# Patient Record
Sex: Female | Born: 1985 | Race: Black or African American | Hispanic: No | Marital: Single | Smoking: Current every day smoker
Health system: Southern US, Community
[De-identification: ages and names within clinical notes are randomized; demographics above are authoritative.]

## PROBLEM LIST (undated history)

## (undated) DIAGNOSIS — F419 Anxiety disorder, unspecified: Secondary | ICD-10-CM

## (undated) DIAGNOSIS — D573 Sickle-cell trait: Secondary | ICD-10-CM

## (undated) DIAGNOSIS — S0990XA Unspecified injury of head, initial encounter: Secondary | ICD-10-CM

## (undated) DIAGNOSIS — J45909 Unspecified asthma, uncomplicated: Secondary | ICD-10-CM

## (undated) HISTORY — PX: NO PAST SURGERIES: SHX2092

---

## 2016-03-11 ENCOUNTER — Encounter (HOSPITAL_COMMUNITY): Payer: Self-pay

## 2016-03-11 DIAGNOSIS — S61411A Laceration without foreign body of right hand, initial encounter: Secondary | ICD-10-CM | POA: Insufficient documentation

## 2016-03-11 DIAGNOSIS — W268XXA Contact with other sharp object(s), not elsewhere classified, initial encounter: Secondary | ICD-10-CM | POA: Insufficient documentation

## 2016-03-11 DIAGNOSIS — Z91040 Latex allergy status: Secondary | ICD-10-CM | POA: Insufficient documentation

## 2016-03-11 DIAGNOSIS — Y999 Unspecified external cause status: Secondary | ICD-10-CM | POA: Insufficient documentation

## 2016-03-11 DIAGNOSIS — Y939 Activity, unspecified: Secondary | ICD-10-CM | POA: Insufficient documentation

## 2016-03-11 DIAGNOSIS — Y929 Unspecified place or not applicable: Secondary | ICD-10-CM | POA: Insufficient documentation

## 2016-03-11 DIAGNOSIS — J45909 Unspecified asthma, uncomplicated: Secondary | ICD-10-CM | POA: Insufficient documentation

## 2016-03-11 NOTE — ED Triage Notes (Signed)
Pt states she was cut in right hand by broken broom; pt presents with lack in palm of right hand; bleeding controled at triage; pt c/o pain at 10/10 on arrival.

## 2016-03-12 ENCOUNTER — Emergency Department (HOSPITAL_COMMUNITY): Payer: Self-pay

## 2016-03-12 ENCOUNTER — Emergency Department (HOSPITAL_COMMUNITY)
Admission: EM | Admit: 2016-03-12 | Discharge: 2016-03-12 | Disposition: A | Discharge status: Home / Self Care | Payer: Self-pay | Attending: Emergency Medicine | Admitting: Emergency Medicine

## 2016-03-12 DIAGNOSIS — S61411A Laceration without foreign body of right hand, initial encounter: Secondary | ICD-10-CM

## 2016-03-12 HISTORY — DX: Unspecified asthma, uncomplicated: J45.909

## 2016-03-12 MED ORDER — OXYCODONE-ACETAMINOPHEN 5-325 MG PO TABS
1.0000 | ORAL_TABLET | Freq: Once | ORAL | Status: AC
  Administered 2016-03-12: 1 via ORAL
  Filled 2016-03-12: qty 1

## 2016-03-12 MED ORDER — HYDROCODONE-ACETAMINOPHEN 5-325 MG PO TABS: 1.0000 | ORAL_TABLET | Freq: Once | ORAL | Status: DC

## 2016-03-12 MED ORDER — LIDOCAINE HCL (PF) 1 % IJ SOLN
5.0000 mL | Freq: Once | INTRAMUSCULAR | Status: AC
  Administered 2016-03-12: 5 mL
  Filled 2016-03-12: qty 5

## 2016-03-12 NOTE — ED Notes (Signed)
Applied gauze dressing to pt hand.  Delay explained to pt

## 2016-03-12 NOTE — ED Notes (Signed)
Patient transported to x-ray. ?

## 2016-03-12 NOTE — ED Provider Notes (Signed)
[facility]-EMERGENCY DEPT Provider Note   CSN: [id number] Arrival date & time: 03/11/16  2218     History   Chief Complaint Chief Complaint  Patient presents with  . Extremity Laceration    HPI Jacqueline [name] is a 31 y.o. female.  Patient presents with laceration to right palm caused by catching the hand against a broken metal broom handle earlier this evening. No other injury. She reports her last tetanus was 2 years ago.   The history is provided by the patient. A language interpreter was used.    Past Medical History:  Diagnosis Date  . Asthma     There are no active problems to display for this patient.   History reviewed. No pertinent surgical history.  OB History    No data available       Home Medications    Prior to Admission medications   Not on File    Family History No family history on file.  Social History Social History  Substance Use Topics  . Smoking status: Not on file  . Smokeless tobacco: Not on file  . Alcohol use Not on file     Allergies   Latex and Norco [hydrocodone-acetaminophen]   Review of Systems Review of Systems  Constitutional: Negative for diaphoresis.  Gastrointestinal: Negative for nausea.  Musculoskeletal:       See HPI.  Skin: Positive for wound.  Neurological: Negative for numbness.     Physical Exam Updated Vital Signs BP 117/81   Pulse 79   Temp 97.8 F (36.6 C) (Oral)   Resp 18   LMP 02/08/2016 (Approximate)   SpO2 96%   Physical Exam  Constitutional: She is oriented to person, place, and time. She appears well-developed and well-nourished.  Neck: Normal range of motion.  Pulmonary/Chest: Effort normal.  Musculoskeletal:  FROM all digits.  Neurological: She is alert and oriented to person, place, and time.  Skin: Skin is warm and dry.  2 cm laceration to palmar right hand. No tendon exposure.      ED Treatments / Results  Labs (all labs ordered are listed, but only abnormal results are  displayed) Labs Reviewed - No data to display  EKG  EKG Interpretation None       Radiology Dg Hand Complete Right  Result Date: 03/12/2016 CLINICAL DATA:  31 year old female with laceration of the right palm. EXAM: RIGHT HAND - COMPLETE 3+ VIEW COMPARISON:  None. FINDINGS: There is no acute fracture or dislocation. The bones are well mineralized. No arthritic changes. Skin laceration of the palm. No radiopaque foreign object. IMPRESSION: No acute osseous pathology.  No radiopaque foreign object. Electronically Signed   By: Elgie Collard M.D.   On: 03/12/2016 02:48    Procedures Procedures (including critical care time) LACERATION REPAIR Performed by: Elpidio Anis A Authorized by: Elpidio Anis A Consent: Verbal consent obtained. Risks and benefits: risks, benefits and alternatives were discussed Consent given by: patient Patient identity confirmed: provided demographic data Prepped and Draped in normal sterile fashion Wound explored  Laceration Location: right palmar hand  Laceration Length: 2 cm  No Foreign Bodies seen or palpated  Anesthesia: local infiltration  Local anesthetic: lidocaine 2% w/o epinephrine  Anesthetic total: 2 ml  Irrigation method: syringe Amount of cleaning: standard  Skin closure: 4-0 prolene  Number of sutures: 4  Technique: simple interrupted  Patient tolerance: Patient tolerated the procedure well with no immediate complications.  Medications Ordered in ED Medications  lidocaine (PF) (XYLOCAINE) 1 %  injection 5 mL (5 mLs Infiltration Given 03/12/16 0350)  oxyCODONE-acetaminophen (PERCOCET/ROXICET) 5-325 MG per tablet 1 tablet (1 tablet Oral Given 03/12/16 0341)     Initial Impression / Assessment and Plan / ED Course  I have reviewed the triage vital signs and the nursing notes.  Pertinent labs & imaging results that were available during my care of the patient were reviewed by me and considered in my medical decision making  (see chart for details).     Laceration to hand caused by metal broom handle. No loss of function. No FB visualized on direct inspection or imaging. Repaired as per above note.  Final Clinical Impressions(s) / ED Diagnoses   Final diagnoses:  None   1. Right hand laceration  New Prescriptions New Prescriptions   No medications on file     Elpidio AnisShari Anniebelle Devore, PA-C 03/12/16 0440    Tomasita CrumbleAdeleke Oni, MD 03/12/16 [phone]

## 2017-05-19 ENCOUNTER — Other Ambulatory Visit: Payer: Self-pay

## 2017-05-19 ENCOUNTER — Emergency Department (HOSPITAL_BASED_OUTPATIENT_CLINIC_OR_DEPARTMENT_OTHER)
Admission: EM | Admit: 2017-05-19 | Discharge: 2017-05-19 | Disposition: A | Discharge status: Home / Self Care | Payer: Self-pay | Attending: Emergency Medicine | Admitting: Emergency Medicine

## 2017-05-19 ENCOUNTER — Encounter (HOSPITAL_BASED_OUTPATIENT_CLINIC_OR_DEPARTMENT_OTHER): Payer: Self-pay

## 2017-05-19 ENCOUNTER — Emergency Department (HOSPITAL_BASED_OUTPATIENT_CLINIC_OR_DEPARTMENT_OTHER): Payer: Self-pay

## 2017-05-19 DIAGNOSIS — R110 Nausea: Secondary | ICD-10-CM

## 2017-05-19 DIAGNOSIS — J45909 Unspecified asthma, uncomplicated: Secondary | ICD-10-CM | POA: Insufficient documentation

## 2017-05-19 DIAGNOSIS — A599 Trichomoniasis, unspecified: Secondary | ICD-10-CM | POA: Insufficient documentation

## 2017-05-19 DIAGNOSIS — R51 Headache: Secondary | ICD-10-CM | POA: Insufficient documentation

## 2017-05-19 DIAGNOSIS — A64 Unspecified sexually transmitted disease: Secondary | ICD-10-CM

## 2017-05-19 LAB — URINALYSIS, MICROSCOPIC (REFLEX): RBC / HPF: NONE SEEN RBC/hpf (ref 0–5)

## 2017-05-19 LAB — URINALYSIS, ROUTINE W REFLEX MICROSCOPIC
BILIRUBIN URINE: NEGATIVE
GLUCOSE, UA: NEGATIVE mg/dL
HGB URINE DIPSTICK: NEGATIVE
Ketones, ur: NEGATIVE mg/dL
Nitrite: NEGATIVE
PH: 6 (ref 5.0–8.0)
Protein, ur: 30 mg/dL — AB

## 2017-05-19 LAB — PREGNANCY, URINE: Preg Test, Ur: NEGATIVE

## 2017-05-19 MED ORDER — METRONIDAZOLE 500 MG PO TABS
2000.0000 mg | ORAL_TABLET | Freq: Once | ORAL | Status: AC
  Administered 2017-05-19: 2000 mg via ORAL
  Filled 2017-05-19: qty 4

## 2017-05-19 MED ORDER — ONDANSETRON 8 MG PO TBDP
8.0000 mg | ORAL_TABLET | Freq: Once | ORAL | Status: AC
  Administered 2017-05-19: 8 mg via ORAL
  Filled 2017-05-19: qty 1

## 2017-05-19 MED ORDER — CEFTRIAXONE SODIUM 250 MG IJ SOLR
250.0000 mg | Freq: Once | INTRAMUSCULAR | Status: AC
  Administered 2017-05-19: 250 mg via INTRAMUSCULAR
  Filled 2017-05-19: qty 250

## 2017-05-19 MED ORDER — AZITHROMYCIN 1 G PO PACK
1.0000 g | PACK | Freq: Once | ORAL | Status: AC
  Administered 2017-05-19: 1 g via ORAL
  Filled 2017-05-19: qty 1

## 2017-05-19 NOTE — ED Provider Notes (Signed)
[facility] EMERGENCY DEPARTMENT Provider Note   CSN: [id number] Arrival date & time: 05/19/17  0302     History   Chief Complaint Chief Complaint  Patient presents with  . Nausea    HPI Jacqueline [name] is a 32 y.o. female.  The history is provided by the patient.  Emesis   The current episode started more than 1 week ago. The problem occurs 5 to 10 times per day (dry heaves when she first wakes up and sits up and dry heaves.  ). Emesis appearance: dry heaves. There has been no fever. Pertinent negatives include no abdominal pain, no arthralgias, no chills, no cough, no diarrhea, no fever, no myalgias, no sweats and no URI. Risk factors: none.  Denies f/c/r. Denies bleeding or discharge.  Denies constipation or diarrhea.  Denies changes in vision or speech, no weakness or numbness.   Past Medical History:  Diagnosis Date  . Asthma     There are no active problems to display for this patient.   History reviewed. No pertinent surgical history.   OB History   None      Home Medications    Prior to Admission medications   Not on File    Family History No family history on file.  Social History Social History   Tobacco Use  . Smoking status: Not on file  Substance Use Topics  . Alcohol use: Not on file  . Drug use: Not on file     Allergies   Latex and Norco [hydrocodone-acetaminophen]   Review of Systems Review of Systems  Constitutional: Negative for appetite change, chills and fever.  Respiratory: Negative for cough.   Cardiovascular: Negative for chest pain.  Gastrointestinal: Positive for vomiting. Negative for abdominal pain and diarrhea.  Genitourinary: Negative for dysuria, flank pain, genital sores, menstrual problem, pelvic pain, vaginal bleeding and vaginal discharge.  Musculoskeletal: Negative for arthralgias and myalgias.  Neurological: Negative for seizures, syncope, speech difficulty, weakness and numbness.  All other systems  reviewed and are negative.    Physical Exam Updated Vital Signs BP (!) 151/97 (BP Location: Right Arm)   Pulse 87   Temp 98 F (36.7 C) (Oral)   Resp 18   Ht  (1.702 m)   Wt 75.8 kg (167 lb)   LMP 04/20/2017 Comment: not a full period on 4/1, last normal period was in 02/2017  SpO2 100%   BMI 26.16 kg/m   Physical Exam  Constitutional: She is oriented to person, place, and time. She appears well-developed and well-nourished. No distress.  HENT:  Head: Normocephalic and atraumatic.  Nose: Nose normal.  Mouth/Throat: No oropharyngeal exudate.  Eyes: Pupils are equal, round, and reactive to light. Conjunctivae and EOM are normal.  Neck: Normal range of motion. Neck supple.  Cardiovascular: Normal rate, regular rhythm, normal heart sounds and intact distal pulses.  Pulmonary/Chest: Effort normal and breath sounds normal. No stridor. She has no wheezes. She has no rales.  Abdominal: Soft. Bowel sounds are normal. She exhibits no mass. There is no tenderness. There is no rebound and no guarding.  Musculoskeletal: Normal range of motion.  Neurological: She is alert and oriented to person, place, and time. She displays normal reflexes.  Skin: Skin is warm and dry. Capillary refill takes less than 2 seconds.  Psychiatric: She has a normal mood and affect.     ED Treatments / Results  Labs (all labs ordered are listed, but only abnormal results are displayed) Results for  orders placed or performed during the hospital encounter of 05/19/17  Pregnancy, urine  Result Value Ref Range   Preg Test, Ur NEGATIVE NEGATIVE  Urinalysis, Routine w reflex microscopic  Result Value Ref Range   Color, Urine YELLOW YELLOW   APPearance HAZY (A) CLEAR   Specific Gravity, Urine >1.030 (H) 1.005 - 1.030   pH 6.0 5.0 - 8.0   Glucose, UA NEGATIVE NEGATIVE mg/dL   Hgb urine dipstick NEGATIVE NEGATIVE   Bilirubin Urine NEGATIVE NEGATIVE   Ketones, ur NEGATIVE NEGATIVE mg/dL   Protein, ur 30  (A) NEGATIVE mg/dL   Nitrite NEGATIVE NEGATIVE   Leukocytes, UA TRACE (A) NEGATIVE  Urinalysis, Microscopic (reflex)  Result Value Ref Range   RBC / HPF NONE SEEN 0 - 5 RBC/hpf   WBC, UA 11-20 0 - 5 WBC/hpf   Bacteria, UA RARE (A) NONE SEEN   Squamous Epithelial / LPF 6-10 0 - 5   Trichomonas, UA PRESENT    Ct Head Wo Contrast  Result Date: 05/19/2017 CLINICAL DATA:  Frontal headache with nausea EXAM: CT HEAD WITHOUT CONTRAST TECHNIQUE: Contiguous axial images were obtained from the base of the skull through the vertex without intravenous contrast. COMPARISON:  None. FINDINGS: Brain: No evidence of acute infarction, hemorrhage, hydrocephalus, extra-axial collection or mass lesion/mass effect. Vascular: No hyperdense vessel or unexpected calcification. Skull: Mild trigonocephaly.  No fracture. Sinuses/Orbits: No acute finding. Other: None IMPRESSION: Negative.  No CT evidence for acute intracranial abnormality. Electronically Signed   By: Jasmine Pang M.D.   On: 05/19/2017 04:01   Radiology Ct Head Wo Contrast  Result Date: 05/19/2017 CLINICAL DATA:  Frontal headache with nausea EXAM: CT HEAD WITHOUT CONTRAST TECHNIQUE: Contiguous axial images were obtained from the base of the skull through the vertex without intravenous contrast. COMPARISON:  None. FINDINGS: Brain: No evidence of acute infarction, hemorrhage, hydrocephalus, extra-axial collection or mass lesion/mass effect. Vascular: No hyperdense vessel or unexpected calcification. Skull: Mild trigonocephaly.  No fracture. Sinuses/Orbits: No acute finding. Other: None IMPRESSION: Negative.  No CT evidence for acute intracranial abnormality. Electronically Signed   By: Jasmine Pang M.D.   On: 05/19/2017 04:01    Procedures Procedures (including critical care time)  Medications Ordered in ED Medications  cefTRIAXone (ROCEPHIN) injection 250 mg (has no administration in time range)  azithromycin (ZITHROMAX) powder 1 g (has no  administration in time range)  metroNIDAZOLE (FLAGYL) tablet 2,000 mg (has no administration in time range)  ondansetron (ZOFRAN-ODT) disintegrating tablet 8 mg (8 mg Oral Given 05/19/17 0332)     Final Clinical Impressions(s) / ED Diagnoses   No signs of space occupying lesion on CT.  Symptoms are undoubtedly due to trichomonas.  Will also treat for GC and chlamydia.  No sexual activity of any kind until 7 days after all partners treated for GC, chlamydia and trichomonas.  Patient verbalizes understanding.    Return for weakness, numbness, changes in vision or speech, fevers >100.4 unrelieved by medication, shortness of breath, intractable vomiting, or diarrhea, abdominal pain, Inability to tolerate liquids or food, cough, altered mental status or any concerns. No signs of systemic illness or infection. The patient is nontoxic-appearing on exam and vital signs are within normal limits.   I have reviewed the triage vital signs and the nursing notes. Pertinent labs &imaging results that were available during my care of the patient were reviewed by me and considered in my medical decision making (see chart for details).  After history, exam, and medical workup I  feel the patient has been appropriately medically screened and is safe for discharge home. Pertinent diagnoses were discussed with the patient. Patient was given return precautions.   Monaye Blackie, MD 05/19/17 [phone]

## 2017-05-19 NOTE — ED Triage Notes (Signed)
Pt c/o nausea and dry heaving in the morning for the last week, no abdominal pain, no diarrhea, no fevers

## 2017-07-16 ENCOUNTER — Encounter (HOSPITAL_COMMUNITY): Payer: Self-pay | Admitting: *Deleted

## 2017-07-16 ENCOUNTER — Emergency Department (HOSPITAL_BASED_OUTPATIENT_CLINIC_OR_DEPARTMENT_OTHER): Payer: Self-pay

## 2017-07-16 ENCOUNTER — Other Ambulatory Visit: Payer: Self-pay

## 2017-07-16 ENCOUNTER — Other Ambulatory Visit: Payer: Self-pay | Admitting: Orthopedic Surgery

## 2017-07-16 ENCOUNTER — Encounter (HOSPITAL_BASED_OUTPATIENT_CLINIC_OR_DEPARTMENT_OTHER): Payer: Self-pay | Admitting: Emergency Medicine

## 2017-07-16 ENCOUNTER — Emergency Department (HOSPITAL_BASED_OUTPATIENT_CLINIC_OR_DEPARTMENT_OTHER)
Admission: EM | Admit: 2017-07-16 | Discharge: 2017-07-16 | Disposition: A | Discharge status: Home / Self Care | Payer: Self-pay | Attending: Emergency Medicine | Admitting: Emergency Medicine

## 2017-07-16 DIAGNOSIS — S62303A Unspecified fracture of third metacarpal bone, left hand, initial encounter for closed fracture: Secondary | ICD-10-CM | POA: Insufficient documentation

## 2017-07-16 DIAGNOSIS — W228XXA Striking against or struck by other objects, initial encounter: Secondary | ICD-10-CM | POA: Insufficient documentation

## 2017-07-16 DIAGNOSIS — Y939 Activity, unspecified: Secondary | ICD-10-CM | POA: Insufficient documentation

## 2017-07-16 DIAGNOSIS — J45909 Unspecified asthma, uncomplicated: Secondary | ICD-10-CM | POA: Insufficient documentation

## 2017-07-16 DIAGNOSIS — Y929 Unspecified place or not applicable: Secondary | ICD-10-CM | POA: Insufficient documentation

## 2017-07-16 DIAGNOSIS — Y998 Other external cause status: Secondary | ICD-10-CM | POA: Insufficient documentation

## 2017-07-16 MED ORDER — OXYCODONE-ACETAMINOPHEN 5-325 MG PO TABS: 1.0000 | ORAL_TABLET | Freq: Three times a day (TID) | ORAL | 0 refills | Status: AC | PRN

## 2017-07-16 MED ORDER — ACETAMINOPHEN ER 650 MG PO TBCR: 650.0000 mg | EXTENDED_RELEASE_TABLET | Freq: Three times a day (TID) | ORAL | 0 refills | Status: AC

## 2017-07-16 MED ORDER — OXYCODONE-ACETAMINOPHEN 5-325 MG PO TABS
1.0000 | ORAL_TABLET | Freq: Once | ORAL | Status: AC
  Administered 2017-07-16: 1 via ORAL
  Filled 2017-07-16: qty 1

## 2017-07-16 NOTE — Discharge Instructions (Addendum)
Take Tylenol around-the-clock for pain, and Percocet only if the pain is severe. See the hand surgeon as requested.

## 2017-07-16 NOTE — ED Triage Notes (Signed)
Pt c/o 10/10 left hand pain after she hit it with a door last night. Hand looks swollen on triage.

## 2017-07-16 NOTE — ED Provider Notes (Signed)
[facility] EMERGENCY DEPARTMENT Provider Note   CSN: [id number] Arrival date & time: 07/16/17  0321     History   Chief Complaint Chief Complaint  Patient presents with  . Hand Injury    HPI Jacqueline [name] is a 32 y.o. female.  HPI  32 year old female comes in with chief complaint of hand pain. Yesterday patient had punched a piece of furniture and instantly had severe pain.  Over time her pain and swelling is gone up, therefore she decided to come to the ER.  Patient has numbness over the dorsal part of her hand, but denies any numbness over her fingers.  Pain is primarily at the dorsum of the hand, where there is significant swelling as well.  Patient is right-handed.  Past Medical History:  Diagnosis Date  . Asthma     There are no active problems to display for this patient.   History reviewed. No pertinent surgical history.   OB History   None      Home Medications    Prior to Admission medications   Medication Sig Start Date End Date Taking? Authorizing Provider  acetaminophen (TYLENOL 8 HOUR) 650 MG CR tablet Take 1 tablet (650 mg total) by mouth every 8 (eight) hours. 07/16/17   Derwood Kaplan, MD  oxyCODONE-acetaminophen (PERCOCET/ROXICET) 5-325 MG tablet Take 1 tablet by mouth every 8 (eight) hours as needed for severe pain. 07/16/17   Derwood Kaplan, MD    Family History History reviewed. No pertinent family history.  Social History Social History   Tobacco Use  . Smoking status: Never Smoker  . Smokeless tobacco: Never Used  Substance Use Topics  . Alcohol use: Not Currently  . Drug use: Never     Allergies   Latex and Norco [hydrocodone-acetaminophen]   Review of Systems Review of Systems  Constitutional: Positive for activity change.  Musculoskeletal: Positive for arthralgias and myalgias.  Skin: Negative for wound.  Neurological: Negative for numbness.     Physical Exam Updated Vital Signs BP (!) 133/93 (BP  Location: Right Arm)   Pulse 88   Temp 98.3 F (36.8 C) (Oral)   Resp 18   Ht 5\' 7"  (1.702 m)   Wt 75.8 kg (167 lb)   LMP 07/16/2017   SpO2 100%   BMI 26.16 kg/m   Physical Exam  Constitutional: She is oriented to person, place, and time. She appears well-developed.  HENT:  Head: Normocephalic and atraumatic.  Eyes: EOM are normal.  Neck: Normal range of motion. Neck supple.  Cardiovascular: Normal rate.  Pulmonary/Chest: Effort normal.  Abdominal: Bowel sounds are normal.  Musculoskeletal:  Patient has significant edema over the dorsum of her left hand.  She has tenderness to palpation in that region and subjective numbness. No tenderness over the wrist and sensory exam over the digits is normal.  Neurological: She is alert and oriented to person, place, and time.  Skin: Skin is warm and dry.  Nursing note and vitals reviewed.    ED Treatments / Results  Labs (all labs ordered are listed, but only abnormal results are displayed) Labs Reviewed - No data to display  EKG None  Radiology Dg Hand Complete Left  Result Date: 07/16/2017 CLINICAL DATA:  32 y/o F; punched a cabinet door. Pain of the second, third, and fourth metacarpals. EXAM: LEFT HAND - COMPLETE 3+ VIEW COMPARISON:  None. FINDINGS: Acute oblique mildly displaced fracture of the third metacarpal diaphysis. No additional fracture or joint dislocation. IMPRESSION: Acute oblique  mildly displaced fracture of the third metacarpal diaphysis. Electronically Signed   By: Mitzi HansenLance  Furusawa-Stratton M.D.   On: 07/16/2017 03:55    Procedures Procedures (including critical care time)  Medications Ordered in ED Medications  oxyCODONE-acetaminophen (PERCOCET/ROXICET) 5-325 MG per tablet 1 tablet (has no administration in time range)     Initial Impression / Assessment and Plan / ED Course  I have reviewed the triage vital signs and the nursing notes.  Pertinent labs & imaging results that were available during my  care of the patient were reviewed by me and considered in my medical decision making (see chart for details).     Patient comes in with chief complaint of hand swelling and pain.  She injured herself when she punched a piece of furniture, and as a result it seems like she has a metacarpal fracture that is displaced.  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  We will advised her to follow-up with hand surgery.   Given the significant edema over the dorsum of the hand we will put her in a volar splint rather than a radial gutter.  Final Clinical Impressions(s) / ED Diagnoses   Final diagnoses:  Closed displaced fracture of third metacarpal bone of left hand, unspecified portion of metacarpal, initial encounter    ED Discharge Orders        Ordered    oxyCODONE-acetaminophen (PERCOCET/ROXICET) 5-325 MG tablet  Every 8 hours PRN     07/16/17 0421    acetaminophen (TYLENOL 8 HOUR) 650 MG CR tablet  Every 8 hours     07/16/17 0421       Derwood KaplanNanavati, Kamariyah Timberlake, MD 07/16/17 [phone]

## 2017-07-17 ENCOUNTER — Encounter (HOSPITAL_COMMUNITY)
Admission: RE | Disposition: A | Discharge status: Home / Self Care | Payer: Self-pay | Source: Ambulatory Visit | Attending: Orthopedic Surgery

## 2017-07-17 ENCOUNTER — Encounter (HOSPITAL_COMMUNITY): Payer: Self-pay | Admitting: *Deleted

## 2017-07-17 ENCOUNTER — Ambulatory Visit (HOSPITAL_COMMUNITY): Payer: Self-pay | Admitting: Certified Registered Nurse Anesthetist

## 2017-07-17 ENCOUNTER — Ambulatory Visit (HOSPITAL_COMMUNITY)
Admission: RE | Admit: 2017-07-17 | Discharge: 2017-07-17 | Disposition: A | Discharge status: Home / Self Care | Payer: Self-pay | Source: Ambulatory Visit | Attending: Orthopedic Surgery | Admitting: Orthopedic Surgery

## 2017-07-17 DIAGNOSIS — Z91040 Latex allergy status: Secondary | ICD-10-CM | POA: Insufficient documentation

## 2017-07-17 DIAGNOSIS — Z885 Allergy status to narcotic agent status: Secondary | ICD-10-CM | POA: Insufficient documentation

## 2017-07-17 DIAGNOSIS — J45909 Unspecified asthma, uncomplicated: Secondary | ICD-10-CM | POA: Insufficient documentation

## 2017-07-17 DIAGNOSIS — X58XXXA Exposure to other specified factors, initial encounter: Secondary | ICD-10-CM | POA: Insufficient documentation

## 2017-07-17 DIAGNOSIS — S62303A Unspecified fracture of third metacarpal bone, left hand, initial encounter for closed fracture: Secondary | ICD-10-CM | POA: Insufficient documentation

## 2017-07-17 DIAGNOSIS — D573 Sickle-cell trait: Secondary | ICD-10-CM | POA: Insufficient documentation

## 2017-07-17 HISTORY — PX: OPEN REDUCTION INTERNAL FIXATION (ORIF) METACARPAL: SHX6234

## 2017-07-17 HISTORY — DX: Anxiety disorder, unspecified: F41.9

## 2017-07-17 HISTORY — DX: Unspecified injury of head, initial encounter: S09.90XA

## 2017-07-17 HISTORY — DX: Sickle-cell trait: D57.3

## 2017-07-17 LAB — CBC
HCT: 38.1 % (ref 36.0–46.0)
Hemoglobin: 12.2 g/dL (ref 12.0–15.0)
MCH: 23.7 pg — ABNORMAL LOW (ref 26.0–34.0)
MCHC: 32 g/dL (ref 30.0–36.0)
MCV: 74.1 fL — AB (ref 78.0–100.0)
PLATELETS: 214 10*3/uL (ref 150–400)
RBC: 5.14 MIL/uL — AB (ref 3.87–5.11)
RDW: 14.7 % (ref 11.5–15.5)
WBC: 7.2 10*3/uL (ref 4.0–10.5)

## 2017-07-17 LAB — POCT PREGNANCY, URINE: Preg Test, Ur: NEGATIVE

## 2017-07-17 SURGERY — OPEN REDUCTION INTERNAL FIXATION (ORIF) METACARPAL
Anesthesia: General | Site: Hand | Laterality: Left

## 2017-07-17 MED ORDER — 0.9 % SODIUM CHLORIDE (POUR BTL) OPTIME
TOPICAL | Status: DC | PRN
  Administered 2017-07-17: 1000 mL

## 2017-07-17 MED ORDER — OXYCODONE HCL 5 MG PO TABS
ORAL_TABLET | ORAL | Status: AC
  Filled 2017-07-17: qty 1

## 2017-07-17 MED ORDER — BUPIVACAINE HCL (PF) 0.25 % IJ SOLN
INTRAMUSCULAR | Status: AC
  Filled 2017-07-17: qty 30

## 2017-07-17 MED ORDER — OXYCODONE HCL 5 MG/5ML PO SOLN: 5.0000 mg | Freq: Once | ORAL | Status: AC | PRN

## 2017-07-17 MED ORDER — ONDANSETRON HCL 4 MG/2ML IJ SOLN
INTRAMUSCULAR | Status: AC
  Filled 2017-07-17: qty 2

## 2017-07-17 MED ORDER — DEXAMETHASONE SODIUM PHOSPHATE 10 MG/ML IJ SOLN
INTRAMUSCULAR | Status: AC
  Filled 2017-07-17: qty 1

## 2017-07-17 MED ORDER — PHENYLEPHRINE 40 MCG/ML (10ML) SYRINGE FOR IV PUSH (FOR BLOOD PRESSURE SUPPORT)
PREFILLED_SYRINGE | INTRAVENOUS | Status: AC
  Filled 2017-07-17: qty 10

## 2017-07-17 MED ORDER — SUGAMMADEX SODIUM 200 MG/2ML IV SOLN
INTRAVENOUS | Status: AC
  Filled 2017-07-17: qty 2

## 2017-07-17 MED ORDER — FENTANYL CITRATE (PF) 250 MCG/5ML IJ SOLN
INTRAMUSCULAR | Status: AC
  Filled 2017-07-17: qty 5

## 2017-07-17 MED ORDER — MEPERIDINE HCL 50 MG/ML IJ SOLN: 6.2500 mg | INTRAMUSCULAR | Status: DC | PRN

## 2017-07-17 MED ORDER — LIDOCAINE HCL (CARDIAC) PF 100 MG/5ML IV SOSY
PREFILLED_SYRINGE | INTRAVENOUS | Status: DC | PRN
  Administered 2017-07-17: 50 mg via INTRAVENOUS

## 2017-07-17 MED ORDER — LACTATED RINGERS IV SOLN
INTRAVENOUS | Status: DC
  Administered 2017-07-17: 11:00:00 via INTRAVENOUS

## 2017-07-17 MED ORDER — HYDROMORPHONE HCL 1 MG/ML IJ SOLN
0.2500 mg | INTRAMUSCULAR | Status: DC | PRN
  Administered 2017-07-17 (×3): 0.5 mg via INTRAVENOUS

## 2017-07-17 MED ORDER — BUPIVACAINE HCL (PF) 0.25 % IJ SOLN
INTRAMUSCULAR | Status: DC | PRN
  Administered 2017-07-17: 10 mL

## 2017-07-17 MED ORDER — ONDANSETRON HCL 4 MG/2ML IJ SOLN
INTRAMUSCULAR | Status: DC | PRN
  Administered 2017-07-17: 4 mg via INTRAVENOUS

## 2017-07-17 MED ORDER — HYDROMORPHONE HCL 1 MG/ML IJ SOLN
INTRAMUSCULAR | Status: AC
  Administered 2017-07-17: 0.5 mg via INTRAVENOUS
  Filled 2017-07-17: qty 1

## 2017-07-17 MED ORDER — CHLORHEXIDINE GLUCONATE 4 % EX LIQD: 60.0000 mL | Freq: Once | CUTANEOUS | Status: DC

## 2017-07-17 MED ORDER — PROMETHAZINE HCL 25 MG/ML IJ SOLN: 6.2500 mg | INTRAMUSCULAR | Status: DC | PRN

## 2017-07-17 MED ORDER — PROPOFOL 10 MG/ML IV BOLUS
INTRAVENOUS | Status: AC
  Filled 2017-07-17: qty 20

## 2017-07-17 MED ORDER — PROPOFOL 10 MG/ML IV BOLUS
INTRAVENOUS | Status: DC | PRN
  Administered 2017-07-17: 180 mg via INTRAVENOUS

## 2017-07-17 MED ORDER — DEXAMETHASONE SODIUM PHOSPHATE 10 MG/ML IJ SOLN
INTRAMUSCULAR | Status: DC | PRN
  Administered 2017-07-17: 10 mg via INTRAVENOUS

## 2017-07-17 MED ORDER — HYDROMORPHONE HCL 1 MG/ML IJ SOLN
INTRAMUSCULAR | Status: DC | PRN
  Administered 2017-07-17: 0.5 mg via INTRAVENOUS

## 2017-07-17 MED ORDER — CEFAZOLIN SODIUM-DEXTROSE 2-4 GM/100ML-% IV SOLN
2.0000 g | INTRAVENOUS | Status: AC
  Administered 2017-07-17: 2 g via INTRAVENOUS
  Filled 2017-07-17: qty 100

## 2017-07-17 MED ORDER — MIDAZOLAM HCL 2 MG/2ML IJ SOLN
INTRAMUSCULAR | Status: AC
  Filled 2017-07-17: qty 2

## 2017-07-17 MED ORDER — OXYCODONE HCL 5 MG PO TABS
5.0000 mg | ORAL_TABLET | Freq: Once | ORAL | Status: AC | PRN
  Administered 2017-07-17: 5 mg via ORAL

## 2017-07-17 MED ORDER — HYDROMORPHONE HCL 1 MG/ML IJ SOLN
INTRAMUSCULAR | Status: AC
  Filled 2017-07-17: qty 0.5

## 2017-07-17 MED ORDER — HYDROMORPHONE HCL 1 MG/ML IJ SOLN
INTRAMUSCULAR | Status: AC
  Filled 2017-07-17: qty 1

## 2017-07-17 MED ORDER — LACTATED RINGERS IV SOLN: INTRAVENOUS | Status: DC

## 2017-07-17 MED ORDER — FENTANYL CITRATE (PF) 100 MCG/2ML IJ SOLN
INTRAMUSCULAR | Status: DC | PRN
  Administered 2017-07-17 (×3): 50 ug via INTRAVENOUS
  Administered 2017-07-17: 100 ug via INTRAVENOUS

## 2017-07-17 MED ORDER — SUCCINYLCHOLINE CHLORIDE 200 MG/10ML IV SOSY
PREFILLED_SYRINGE | INTRAVENOUS | Status: AC
  Filled 2017-07-17: qty 10

## 2017-07-17 MED ORDER — MIDAZOLAM HCL 5 MG/5ML IJ SOLN
INTRAMUSCULAR | Status: DC | PRN
  Administered 2017-07-17: 2 mg via INTRAVENOUS

## 2017-07-17 SURGICAL SUPPLY — 41 items
BANDAGE ACE 3X5.8 VEL STRL LF (GAUZE/BANDAGES/DRESSINGS) ×3 IMPLANT
BANDAGE ACE 4X5 VEL STRL LF (GAUZE/BANDAGES/DRESSINGS) ×3 IMPLANT
BIT DRILL 1.1 MINI (BIT) ×1 IMPLANT
BNDG CMPR 9X4 STRL LF SNTH (GAUZE/BANDAGES/DRESSINGS) ×1
BNDG ESMARK 4X9 LF (GAUZE/BANDAGES/DRESSINGS) ×3 IMPLANT
BNDG GAUZE ELAST 4 BULKY (GAUZE/BANDAGES/DRESSINGS) ×3 IMPLANT
CLOSURE WOUND 1/2 X4 (GAUZE/BANDAGES/DRESSINGS) ×1
COVER SURGICAL LIGHT HANDLE (MISCELLANEOUS) ×3 IMPLANT
CUFF TOURNIQUET SINGLE 18IN (TOURNIQUET CUFF) ×3 IMPLANT
DRAPE C-ARM MINI 42X72 WSTRAPS (DRAPES) ×3 IMPLANT
DRAPE SURG 17X23 STRL (DRAPES) ×3 IMPLANT
DRILL BIT 1.1 MINI (BIT) ×3
DURAPREP 26ML APPLICATOR (WOUND CARE) ×3 IMPLANT
GAUZE SPONGE 4X4 12PLY STRL (GAUZE/BANDAGES/DRESSINGS) ×3 IMPLANT
GAUZE XEROFORM 1X8 LF (GAUZE/BANDAGES/DRESSINGS) ×3 IMPLANT
GLOVE SURG SYN 8.0 (GLOVE) ×3 IMPLANT
GOWN STRL REUS W/ TWL LRG LVL3 (GOWN DISPOSABLE) ×1 IMPLANT
GOWN STRL REUS W/ TWL XL LVL3 (GOWN DISPOSABLE) ×1 IMPLANT
GOWN STRL REUS W/TWL LRG LVL3 (GOWN DISPOSABLE) ×3
GOWN STRL REUS W/TWL XL LVL3 (GOWN DISPOSABLE) ×3
KIT BASIN OR (CUSTOM PROCEDURE TRAY) ×3 IMPLANT
KIT TURNOVER KIT B (KITS) ×3 IMPLANT
MANIFOLD NEPTUNE II (INSTRUMENTS) ×3 IMPLANT
NEEDLE 22X1 1/2 (OR ONLY) (NEEDLE) ×3 IMPLANT
NS IRRIG 1000ML POUR BTL (IV SOLUTION) ×3 IMPLANT
PACK ORTHO EXTREMITY (CUSTOM PROCEDURE TRAY) ×3 IMPLANT
PAD ARMBOARD 7.5X6 YLW CONV (MISCELLANEOUS) ×3 IMPLANT
PAD CAST 4YDX4 CTTN HI CHSV (CAST SUPPLIES) ×1 IMPLANT
PADDING CAST COTTON 4X4 STRL (CAST SUPPLIES) ×3
SCREW 1.5X10MM (Screw) ×6 IMPLANT
SCREW 1.5X9MM (Screw) ×3 IMPLANT
SCREW BN 9X1.5X3XST CRFRM (Screw) ×1 IMPLANT
SPLINT PLASTER CAST XFAST 4X15 (CAST SUPPLIES) ×1 IMPLANT
SPLINT PLASTER XTRA FAST SET 4 (CAST SUPPLIES) ×2
SPONGE LAP 4X18 RFD (DISPOSABLE) IMPLANT
STRIP CLOSURE SKIN 1/2X4 (GAUZE/BANDAGES/DRESSINGS) ×2 IMPLANT
SUT PROLENE 3 0 PS 2 (SUTURE) ×3 IMPLANT
SYR CONTROL 10ML LL (SYRINGE) ×3 IMPLANT
TOWEL OR 17X24 6PK STRL BLUE (TOWEL DISPOSABLE) ×3 IMPLANT
TOWEL OR 17X26 10 PK STRL BLUE (TOWEL DISPOSABLE) ×3 IMPLANT
UNDERPAD 30X30 (UNDERPADS AND DIAPERS) ×3 IMPLANT

## 2017-07-17 NOTE — Op Note (Signed)
Please see the dictated report [phone]

## 2017-07-17 NOTE — Anesthesia Procedure Notes (Signed)
Procedure Name: LMA Insertion Date/Time: 07/17/2017 1:30 PM Performed by: Adonis Housekeeperongell, Oliana Gowens M, CRNA Pre-anesthesia Checklist: Patient identified, Emergency Drugs available, Patient being monitored and Suction available Patient Re-evaluated:Patient Re-evaluated prior to induction Oxygen Delivery Method: Circle system utilized Preoxygenation: Pre-oxygenation with 100% oxygen Induction Type: IV induction Ventilation: Mask ventilation without difficulty LMA: LMA with gastric port inserted LMA Size: 4.0 Number of attempts: 1 Placement Confirmation: positive ETCO2 and breath sounds checked- equal and bilateral Tube secured with: Tape Dental Injury: Teeth and Oropharynx as per pre-operative assessment

## 2017-07-17 NOTE — H&P (Signed)
Jacqueline [name] is an 32 y.o. female.   Chief Complaint: left hand pain and deformity [id number]:patient's a very pleasant 32 year old right-hand-dominant female status post left hand trauma with displaced and shortened long metacarpal fracture nondominant left hand.  Past Medical History:  Diagnosis Date  . Anxiety   . Asthma    as a child  . Head injury due to trauma    "cut"  . Sickle cell trait [facility](HCC)     Past Surgical History:  Procedure Laterality Date  . NO PAST SURGERIES      History reviewed. No pertinent family history. Social History:  reports that she has been smoking.  She has smoked for the past 9.00 years. She has never used smokeless tobacco. She reports that she drank alcohol. She reports that she does not use drugs.  Allergies:  Allergies  Allergen Reactions  . Latex Itching  . Hydrocodone Hives and Rash    Medications Prior to Admission  Medication Sig Dispense Refill  . acetaminophen (TYLENOL 8 HOUR) 650 MG CR tablet Take 1 tablet (650 mg total) by mouth every 8 (eight) hours. 30 tablet 0  . oxyCODONE-acetaminophen (PERCOCET/ROXICET) 5-325 MG tablet Take 1 tablet by mouth every 8 (eight) hours as needed for severe pain. 12 tablet 0    Results for orders placed or performed during the hospital encounter of 07/17/17 (from the past 48 hour(s))  Pregnancy, urine POC     Status: None   Collection Time: 07/17/17 10:58 AM  Result Value Ref Range   Preg Test, Ur NEGATIVE NEGATIVE    Comment:        THE SENSITIVITY OF THIS METHODOLOGY IS >24 mIU/mL   CBC     Status: Abnormal   Collection Time: 07/17/17 11:09 AM  Result Value Ref Range   WBC 7.2 4.0 - 10.5 K/uL   RBC 5.14 (H) 3.87 - 5.11 MIL/uL   Hemoglobin 12.2 12.0 - 15.0 g/dL   HCT 25.938.1 56.336.0 - 87.546.0 %   MCV 74.1 (L) 78.0 - 100.0 fL   MCH 23.7 (L) 26.0 - 34.0 pg   MCHC 32.0 30.0 - 36.0 g/dL   RDW 64.314.7 32.911.5 - 51.815.5 %   Platelets 214 150 - 400 K/uL    Comment: Performed at [facility], [address] [place], [place] [address]   Dg Hand Complete Left  Result Date: 07/16/2017 CLINICAL DATA:  32 y/o F; punched a cabinet door. Pain of the second, third, and fourth metacarpals. EXAM: LEFT HAND - COMPLETE 3+ VIEW COMPARISON:  None. FINDINGS: Acute oblique mildly displaced fracture of the third metacarpal diaphysis. No additional fracture or joint dislocation. IMPRESSION: Acute oblique mildly displaced fracture of the third metacarpal diaphysis. Electronically Signed   By: Mitzi HansenLance  Furusawa-Stratton M.D.   On: 07/16/2017 03:55    Review of Systems  All other systems reviewed and are negative.   Blood pressure 130/61, pulse 64, temperature 98.7 F (37.1 C), temperature source Oral, resp. rate 18, last menstrual period 07/15/2017, SpO2 100 %. Physical Exam  Constitutional: She is oriented to person, place, and time. She appears well-developed and well-nourished.  HENT:  Head: Normocephalic and atraumatic.  Neck: Normal range of motion.  Cardiovascular: Normal rate.  Respiratory: Effort normal.  Musculoskeletal:       Left hand: She exhibits tenderness, bony tenderness and deformity.  Left hand pain, swelling, and rotational deformity to long finger status post trauma  Neurological: She is alert and oriented to person, place, and  time.  Skin: Skin is warm.  Psychiatric: She has a normal mood and affect. Her behavior is normal. Judgment and thought content normal.     Assessment/Plan 32 year old female with displaced left long finger metacarpal fracture. Have discussed in great detail the nature of her current predicament and treatment options. Patient understands the risks and benefits and wished to proceed with open reduction internal fixation left long finger metacarpal fracture as an outpatient.  Marlowe Shores, MD 07/17/2017, 1:04 PM

## 2017-07-17 NOTE — Anesthesia Preprocedure Evaluation (Addendum)
Anesthesia Evaluation  Patient identified by MRN, date of birth, ID band Patient awake    Reviewed: Allergy & Precautions, NPO status , Patient's Chart, lab work & pertinent test results  Airway Mallampati: I  TM Distance: >3 FB Neck ROM: Full    Dental  (+) Dental Advisory Given, Chipped,    Pulmonary asthma , Current Smoker,    breath sounds clear to auscultation       Cardiovascular negative cardio ROS   Rhythm:Regular Rate:Normal     Neuro/Psych Anxiety negative neurological ROS     GI/Hepatic negative GI ROS, Neg liver ROS,   Endo/Other  negative endocrine ROS  Renal/GU negative Renal ROS     Musculoskeletal negative musculoskeletal ROS (+)   Abdominal Normal abdominal exam  (+)   Peds  Hematology  (+) Sickle cell trait ,   Anesthesia Other Findings   Reproductive/Obstetrics                            Anesthesia Physical Anesthesia Plan  ASA: II  Anesthesia Plan: General   Post-op Pain Management:    Induction: Intravenous  PONV Risk Score and Plan: 3 and Ondansetron, Dexamethasone and Midazolam  Airway Management Planned: LMA  Additional Equipment: None  Intra-op Plan:   Post-operative Plan: Extubation in OR  Informed Consent: I have reviewed the patients History and Physical, chart, labs and discussed the procedure including the risks, benefits and alternatives for the proposed anesthesia with the patient or authorized representative who has indicated his/her understanding and acceptance.   Dental advisory given  Plan Discussed with: CRNA and Anesthesiologist  Anesthesia Plan Comments:        Anesthesia Quick Evaluation

## 2017-07-17 NOTE — Transfer of Care (Signed)
Immediate Anesthesia Transfer of Care Note  Patient: Jacqueline [name]  Procedure(s) Performed: OPEN REDUCTION INTERNAL FIXATION (ORIF)  LEFT LONG METACARPAL (Left Hand)  Patient Location: PACU  Anesthesia Type:General  Level of Consciousness: awake, alert , oriented and patient cooperative  Airway & Oxygen Therapy: Patient Spontanous Breathing and Patient connected to nasal cannula oxygen  Post-op Assessment: Report given to RN and Post -op Vital signs reviewed and stable  Post vital signs: Reviewed and stable  Last Vitals:  Vitals Value Taken Time  BP 140/88 07/17/2017  2:28 PM  Temp 36.1 C 07/17/2017  2:28 PM  Pulse 75 07/17/2017  2:28 PM  Resp 16 07/17/2017  2:29 PM  SpO2 100 % 07/17/2017  2:28 PM  Vitals shown include unvalidated device data.  Last Pain:  Vitals:   07/17/17 1107  TempSrc:   PainSc: 5          Complications: No apparent anesthesia complications

## 2017-07-17 NOTE — Op Note (Signed)
NAMJanus Molder: [name], Jacqueline P. MEDICAL RECORD [id number] ACCOUNT [id number] DATE OF BIRTH:09-May-1985 FACILITY: [facility] LOCATION: [facility] PHYSICIAN:Tobenna Needs A. Mina MarbleWEINGOLD, MD  OPERATIVE REPORT  DATE OF PROCEDURE:  07/17/2017  PREOPERATIVE DIAGNOSIS:  Displaced long finger metacarpal fracture.  POSTOPERATIVE DIAGNOSIS:  Displaced long finger metacarpal fracture.  PROCEDURE:  Open reduction internal fixation left long metacarpal fracture with two 1.5 mm screws and 0 Vicryl cerclage suture.  SURGEON:  Dairl PonderMatthew Verl Whitmore, MD  ASSISTANT:  None.  ANESTHESIA:  General.  COMPLICATIONS:  None.  DRAINS:  None.  DESCRIPTION OF PROCEDURE:  The patient was taken to the operating suite.  After the induction of adequate general anesthetic, left upper extremity was prepped and draped in the usual sterile fashion.  An Esmarch was used to exsanguinate the limb, and the  tourniquet was inflated to 250 mmHg.  At this point in time, an incision was made over the dorsal aspect of the left hand over the long metacarpal.  Skin was incised.  Dissection was carried down to the extensor tendons to the long finger, which were  carefully retracted.  A subperiosteal dissection of the long metacarpal revealed a long spiral oblique fracture with a large proximal volar fragment.  Reduction was performed with longitudinal traction and held with a reduction clamp.  Two 1.5 mm screws  were placed from dorsal to volar under direct and fluoroscopic guidance to maintain the reduction.  A 0 Vicryl suture was passed circumferentially around the fracture site above and below the screws for additional fixation.  Fluoroscopic imaging revealed  adequate reduction of all 3 views.  The wound was irrigated and loosely closed in layers of 0 Vicryl to cover the hardware and a 3-0 Prolene subcuticular stitch on the skin.  Steri-Strips, 4 x 4's, fluffs and a compressive bandage were applied.  The  patient tolerated this procedure well and went to  recovery in stable fashion.  LN/NUANCE  D:07/17/2017 T:07/17/2017 JOB:[id number]

## 2017-07-17 NOTE — Anesthesia Postprocedure Evaluation (Signed)
Anesthesia Post Note  Patient: Jacqueline [name]  Procedure(s) Performed: OPEN REDUCTION INTERNAL FIXATION (ORIF)  LEFT LONG METACARPAL (Left Hand)     Patient location during evaluation: PACU Anesthesia Type: General Level of consciousness: awake and alert Pain management: pain level controlled Vital Signs Assessment: post-procedure vital signs reviewed and stable Respiratory status: spontaneous breathing, nonlabored ventilation, respiratory function stable and patient connected to nasal cannula oxygen Cardiovascular status: blood pressure returned to baseline and stable Postop Assessment: no apparent nausea or vomiting Anesthetic complications: no    Last Vitals:  Vitals:   07/17/17 1541 07/17/17 1548  BP: (!) 136/94 121/79  Pulse: 62 (!) 51  Resp: 13   Temp: 36.5 C   SpO2: 98% 100%    Last Pain:  Vitals:   07/17/17 1548  TempSrc:   PainSc: 4                  Shelton SilvasKevin D Collie Wernick

## 2017-07-21 ENCOUNTER — Encounter (HOSPITAL_COMMUNITY): Payer: Self-pay | Admitting: Orthopedic Surgery

## 2019-04-23 IMAGING — CT CT HEAD W/O CM
3 series · 14 of 46 positions shown, 16 images · non-contrast
Comparison: None.

CLINICAL DATA: Frontal headache with nausea

EXAM:
CT HEAD WITHOUT CONTRAST
TECHNIQUE: Contiguous axial images were obtained from the base of the skull
through the vertex without intravenous contrast.

[Series 2: head wo · axial · 0.44mm/px · z∈[-186,-66]mm · 8 of 29 slices shown, 10 images]
[im 3/29  brain]
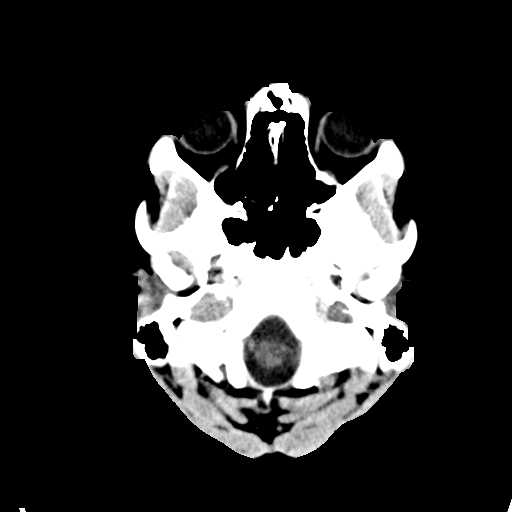
[im 3/29  bone]
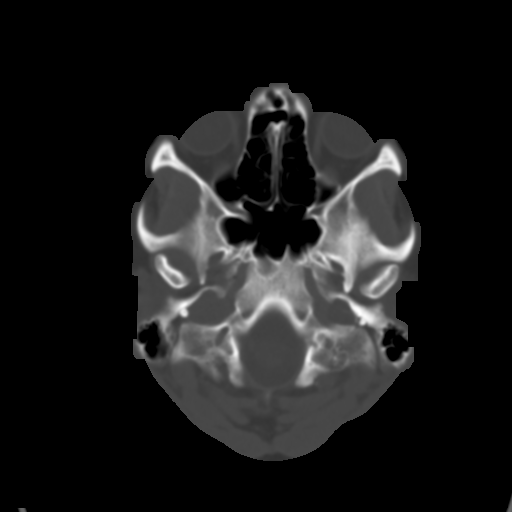
[im 7/29  brain]
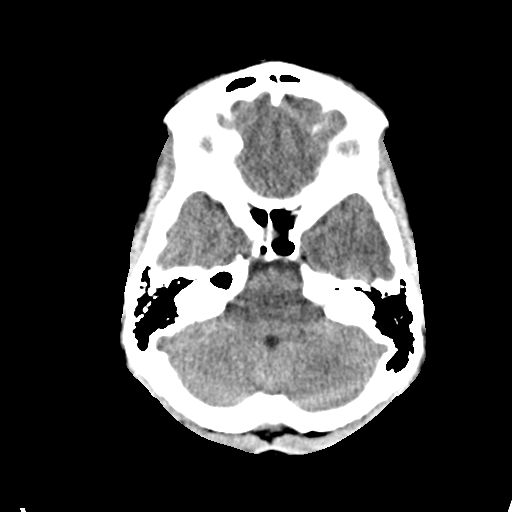
[im 10/29  brain]
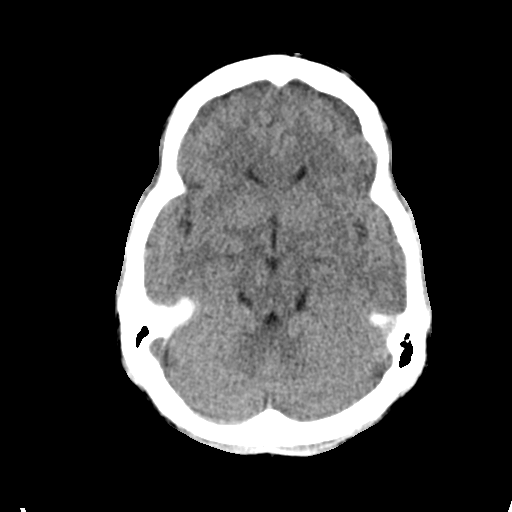
[im 13/29  brain]
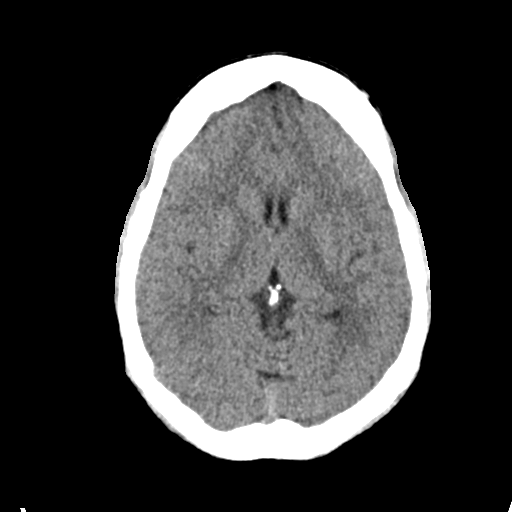
[im 17/29  brain]
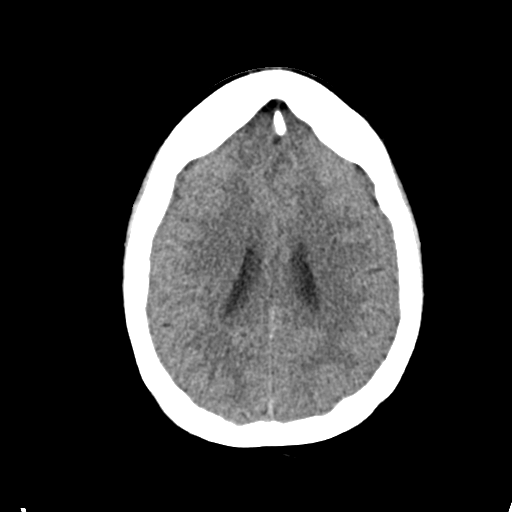
[im 17/29  bone]
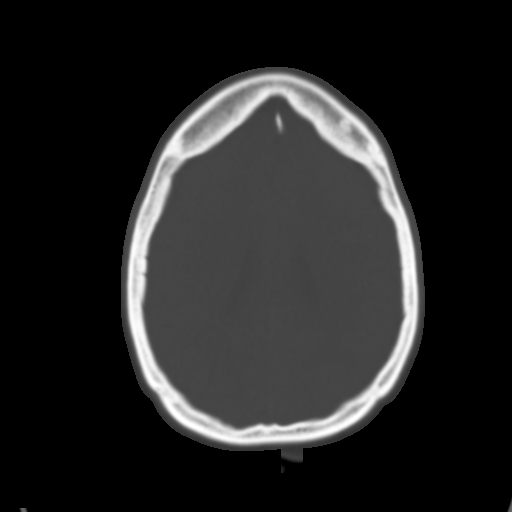
[im 20/29  brain]
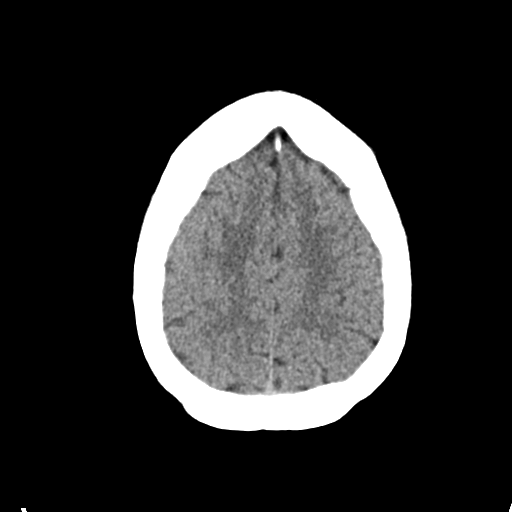
[im 23/29  brain]
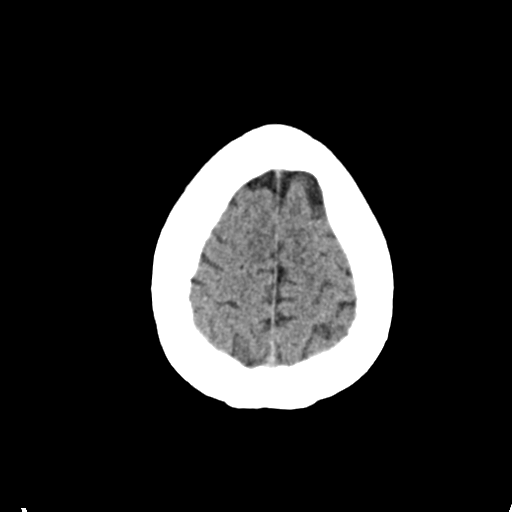
[im 27/29  brain]
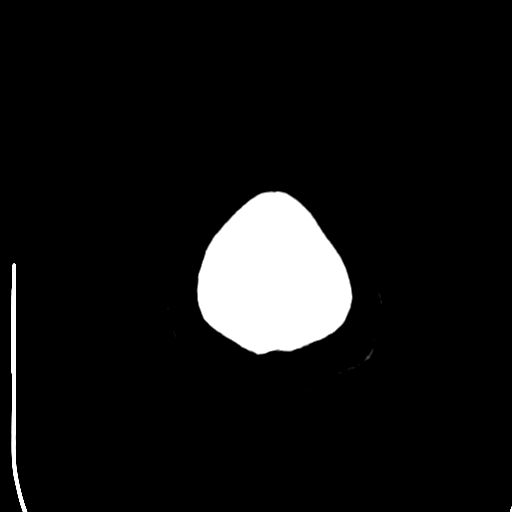

[Series 4: coronal soft · coronal · 0.30mm/px · 3 of 63 slices shown]
[im 21/63  brain]
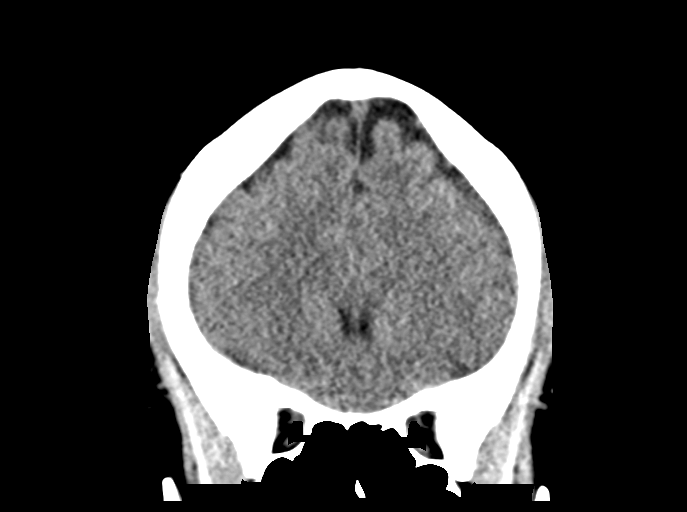
[im 28/63  brain]
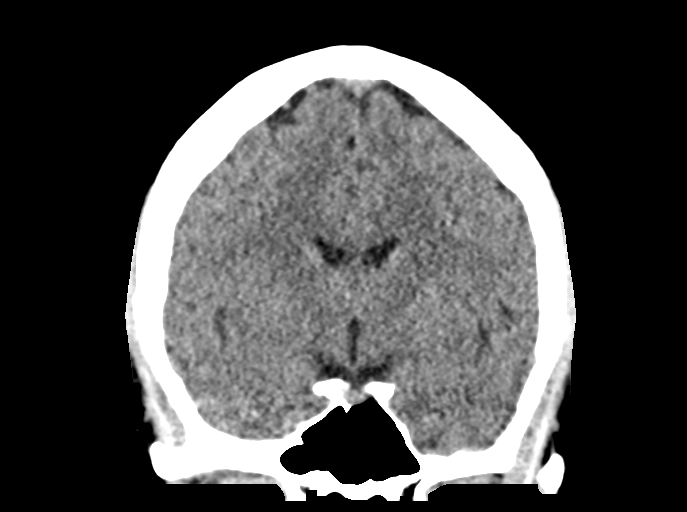
[im 35/63  brain]
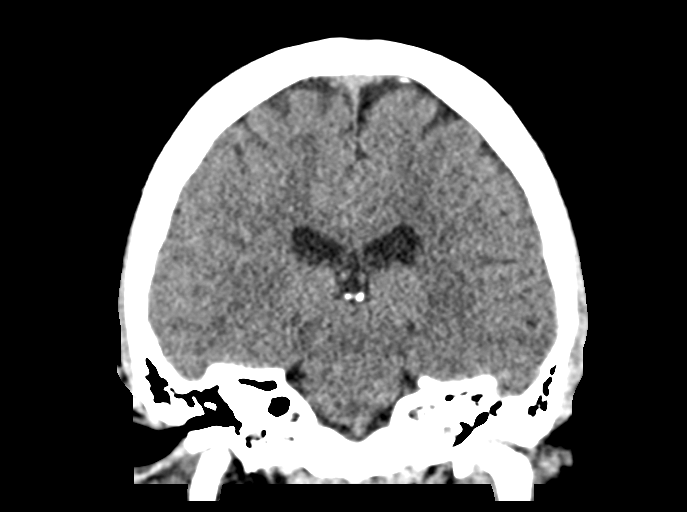

[Series 5: sag soft · sagittal · 0.29mm/px · 3 of 51 slices shown]
[im 17/51  brain]
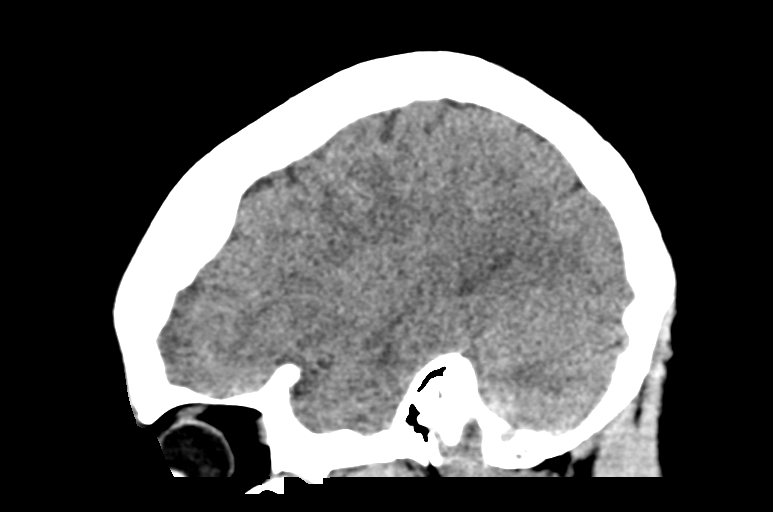
[im 26/51  brain]
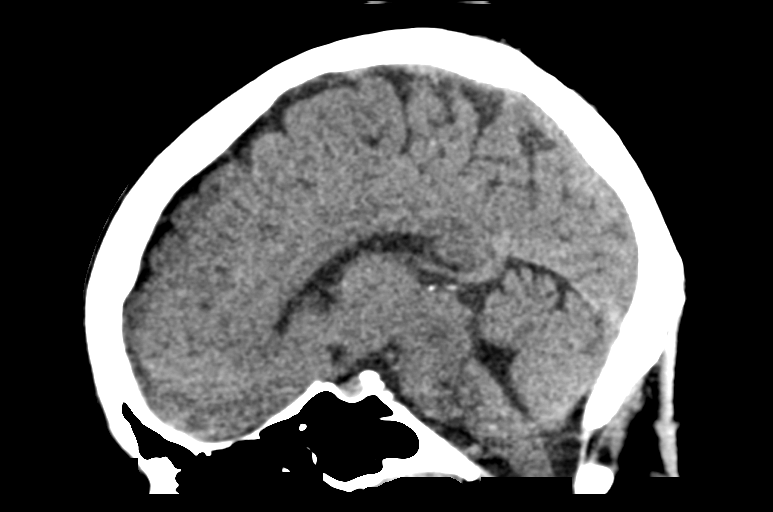
[im 34/51  brain]
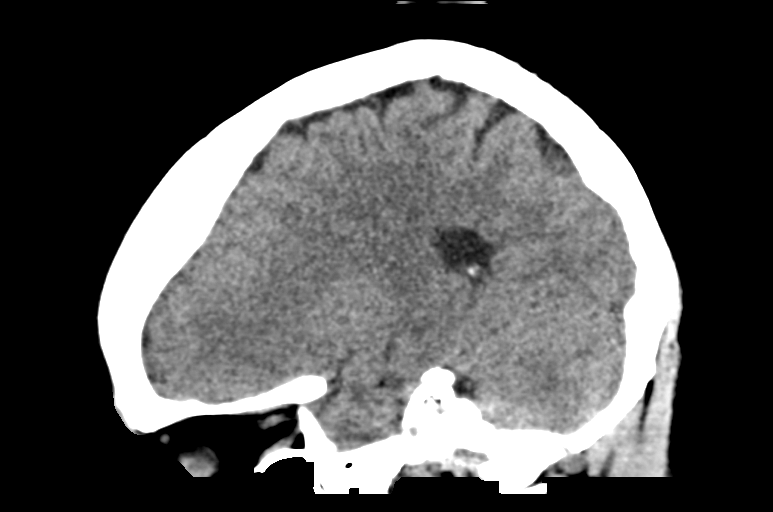

[14 of 46 positions shown; findings below may reference images not displayed]

FINDINGS: Brain: No evidence of acute infarction, hemorrhage, hydrocephalus,
extra-axial collection or mass lesion/mass effect.

Vascular: No hyperdense vessel or unexpected calcification.

Skull: Mild trigonocephaly.  No fracture.

Sinuses/Orbits: No acute finding.

Other: None
IMPRESSION: Negative.  No CT evidence for acute intracranial abnormality.
